# Patient Record
Sex: Male | Born: 1995 | Race: White | Hispanic: No | Marital: Single
Health system: Southern US, Community
[De-identification: ages and names within clinical notes are randomized; demographics above are authoritative.]

---

## 2018-03-23 ENCOUNTER — Emergency Department (HOSPITAL_COMMUNITY): Payer: Self-pay

## 2018-03-23 ENCOUNTER — Encounter (HOSPITAL_COMMUNITY): Payer: Self-pay | Admitting: Internal Medicine

## 2018-03-23 ENCOUNTER — Emergency Department (HOSPITAL_COMMUNITY)
Admission: EM | Admit: 2018-03-23 | Discharge: 2018-03-23 | Disposition: A | Discharge status: Home / Self Care | Payer: Self-pay | Attending: Emergency Medicine | Admitting: Emergency Medicine

## 2018-03-23 DIAGNOSIS — Y9389 Activity, other specified: Secondary | ICD-10-CM | POA: Insufficient documentation

## 2018-03-23 DIAGNOSIS — Y999 Unspecified external cause status: Secondary | ICD-10-CM | POA: Insufficient documentation

## 2018-03-23 DIAGNOSIS — F1910 Other psychoactive substance abuse, uncomplicated: Secondary | ICD-10-CM | POA: Insufficient documentation

## 2018-03-23 DIAGNOSIS — S32010A Wedge compression fracture of first lumbar vertebra, initial encounter for closed fracture: Secondary | ICD-10-CM | POA: Insufficient documentation

## 2018-03-23 DIAGNOSIS — Y92410 Unspecified street and highway as the place of occurrence of the external cause: Secondary | ICD-10-CM | POA: Insufficient documentation

## 2018-03-23 DIAGNOSIS — R0789 Other chest pain: Secondary | ICD-10-CM | POA: Insufficient documentation

## 2018-03-23 DIAGNOSIS — S22080A Wedge compression fracture of T11-T12 vertebra, initial encounter for closed fracture: Secondary | ICD-10-CM | POA: Insufficient documentation

## 2018-03-23 DIAGNOSIS — S0081XA Abrasion of other part of head, initial encounter: Secondary | ICD-10-CM | POA: Insufficient documentation

## 2018-03-23 LAB — CBC WITH DIFFERENTIAL/PLATELET
ABS IMMATURE GRANULOCYTES: 0.1 10*3/uL (ref 0.0–0.1)
Basophils Absolute: 0 10*3/uL (ref 0.0–0.1)
Basophils Relative: 0 %
Eosinophils Absolute: 0.1 10*3/uL (ref 0.0–0.7)
Eosinophils Relative: 1 %
HEMATOCRIT: 40.7 % (ref 39.0–52.0)
Hemoglobin: 14 g/dL (ref 13.0–17.0)
Immature Granulocytes: 1 %
LYMPHS ABS: 0.8 10*3/uL (ref 0.7–4.0)
LYMPHS PCT: 9 %
MCH: 28 pg (ref 26.0–34.0)
MCHC: 34.4 g/dL (ref 30.0–36.0)
MCV: 81.4 fL (ref 78.0–100.0)
MONOS PCT: 6 %
Monocytes Absolute: 0.6 10*3/uL (ref 0.1–1.0)
NEUTROS ABS: 7.5 10*3/uL (ref 1.7–7.7)
NEUTROS PCT: 83 %
Platelets: 267 10*3/uL (ref 150–400)
RBC: 5 MIL/uL (ref 4.22–5.81)
RDW: 11.8 % (ref 11.5–15.5)
WBC: 9 10*3/uL (ref 4.0–10.5)

## 2018-03-23 LAB — COMPREHENSIVE METABOLIC PANEL
ALBUMIN: 3.9 g/dL (ref 3.5–5.0)
ALT: 24 U/L (ref 0–44)
AST: 31 U/L (ref 15–41)
Alkaline Phosphatase: 68 U/L (ref 38–126)
Anion gap: 8 (ref 5–15)
BILIRUBIN TOTAL: 0.8 mg/dL (ref 0.3–1.2)
BUN: 8 mg/dL (ref 6–20)
CHLORIDE: 108 mmol/L (ref 98–111)
CO2: 23 mmol/L (ref 22–32)
CREATININE: 0.96 mg/dL (ref 0.61–1.24)
Calcium: 9.1 mg/dL (ref 8.9–10.3)
GFR calc Af Amer: >60 mL/min (ref >60)
GLUCOSE: 129 mg/dL — AB (ref 70–99)
Potassium: 3.7 mmol/L (ref 3.5–5.1)
Sodium: 139 mmol/L (ref 135–145)
TOTAL PROTEIN: 6.2 g/dL — AB (ref 6.5–8.1)

## 2018-03-23 LAB — RAPID URINE DRUG SCREEN, HOSP PERFORMED
Amphetamines: POSITIVE — AB
Barbiturates: NOT DETECTED
Benzodiazepines: NOT DETECTED
Cocaine: POSITIVE — AB
OPIATES: NOT DETECTED
Tetrahydrocannabinol: NOT DETECTED

## 2018-03-23 LAB — ETHANOL: Alcohol, Ethyl (B): <10 mg/dL (ref <10)

## 2018-03-23 MED ORDER — SODIUM CHLORIDE 0.9 % IV BOLUS
1000.0000 mL | Freq: Once | INTRAVENOUS | Status: AC
  Administered 2018-03-23: 1000 mL via INTRAVENOUS

## 2018-03-23 MED ORDER — ONDANSETRON HCL 4 MG/2ML IJ SOLN
4.0000 mg | Freq: Once | INTRAMUSCULAR | Status: AC
  Administered 2018-03-23: 4 mg via INTRAVENOUS
  Filled 2018-03-23: qty 2

## 2018-03-23 MED ORDER — HYDROMORPHONE HCL 1 MG/ML IJ SOLN
1.0000 mg | Freq: Once | INTRAMUSCULAR | Status: AC
  Administered 2018-03-23: 1 mg via INTRAVENOUS
  Filled 2018-03-23: qty 1

## 2018-03-23 MED ORDER — IOPAMIDOL (ISOVUE-300) INJECTION 61%
INTRAVENOUS | Status: AC
  Administered 2018-03-23: 100 mL via INTRAVENOUS
  Filled 2018-03-23: qty 100

## 2018-03-23 MED ORDER — HYDROCODONE-ACETAMINOPHEN 5-325 MG PO TABS: 2.0000 | ORAL_TABLET | ORAL | 0 refills | Status: AC | PRN

## 2018-03-23 MED ORDER — ORPHENADRINE CITRATE ER 100 MG PO TB12: 100.0000 mg | ORAL_TABLET | Freq: Two times a day (BID) | ORAL | 0 refills | Status: AC

## 2018-03-23 NOTE — ED Notes (Signed)
Pt returned to room from CT

## 2018-03-23 NOTE — ED Notes (Signed)
Patient verbalizes understanding of discharge instructions. Opportunity for questioning and answers were provided. Armband removed by staff, pt discharged from ED.  

## 2018-03-23 NOTE — ED Notes (Signed)
Pt told this RN that he used heroin last night and that he is "trying to get off of it." PA made aware.
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
## 2018-03-23 NOTE — ED Notes (Signed)
Pt c/o being cold. Given warm blankets.

## 2018-03-23 NOTE — Discharge Instructions (Signed)
Take pain medication only as needed, as prescribed. Take muscle relaxor as prescribed. Do NOT drive or operate machinery while taking pain medications. Follow up with neurosurgery, referral given. Return to the ER for worsening or concerning symptoms- the ER is NOT able to refill pain medications.
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
